# Patient Record
Sex: Male | Born: 1990 | Race: Black or African American | Hispanic: No | Marital: Single | Smoking: Never smoker
Health system: Southern US, Community
[De-identification: ages and names within clinical notes are randomized; demographics above are authoritative.]

---

## 2018-08-06 ENCOUNTER — Encounter (HOSPITAL_COMMUNITY): Payer: Self-pay

## 2018-08-06 ENCOUNTER — Ambulatory Visit (HOSPITAL_COMMUNITY)
Admission: EM | Admit: 2018-08-06 | Discharge: 2018-08-06 | Disposition: A | Discharge status: Home / Self Care | Payer: Self-pay | Attending: Family Medicine | Admitting: Family Medicine

## 2018-08-06 ENCOUNTER — Other Ambulatory Visit: Payer: Self-pay

## 2018-08-06 DIAGNOSIS — M25562 Pain in left knee: Secondary | ICD-10-CM

## 2018-08-06 DIAGNOSIS — M25561 Pain in right knee: Secondary | ICD-10-CM

## 2018-08-06 MED ORDER — IBUPROFEN 600 MG PO TABS: 600.0000 mg | ORAL_TABLET | Freq: Four times a day (QID) | ORAL | 0 refills | Status: AC | PRN

## 2018-08-06 NOTE — ED Triage Notes (Signed)
Patient presents to Urgent Care with complaints of bilateral knee pain since 2 weeks ago. Patient reports he has had difficulty walking recently, stating his knees give out under him because of a feeling of fluid in his knees.

## 2018-08-06 NOTE — Discharge Instructions (Addendum)
The exam of your knees today was normal.  Take the prescribed ibuprofen for pain.  Follow-up with the orthopedic listed.  Return here or to the emergency department if you develop numbness, tingling, weakness, bruising, warmth, redness in your knees or legs.

## 2018-08-06 NOTE — ED Provider Notes (Signed)
[place]    CSN: [id number] Arrival date & time: 08/06/18  1040     History   Chief Complaint Chief Complaint  Patient presents with  . Knee Pain    HPI Austin Cervantes is a 28 y.o. male.   He presents today with bilateral knee pain x2 weeks and feeling that he has "fluid" behind his knees.  He denies injury but states he has a history 2 years ago of torn ligaments in his left foot.  He has tried rest ice and elevation for the pain.  He denies numbness, tingling, weakness, bruising, warmth, redness, wounds.   The history is provided by the patient.    History reviewed. No pertinent past medical history.  There are no active problems to display for this patient.   History reviewed. No pertinent surgical history.     Home Medications    Prior to Admission medications   Medication Sig Start Date End Date Taking? Authorizing Provider  ibuprofen (ADVIL) 600 MG tablet Take 1 tablet (600 mg total) by mouth every 6 (six) hours as needed. 08/06/18   Sharion Balloon, NP    Family History Family History  Problem Relation Age of Onset  . Cancer Mother   . Diabetes Mother   . Hypertension Mother     Social History Social History   Tobacco Use  . Smoking status: Never Smoker  . Smokeless tobacco: Never Used  Substance Use Topics  . Alcohol use: Not Currently  . Drug use: Not on file     Allergies   Patient has no known allergies.   Review of Systems Review of Systems  Constitutional: Negative for chills and fever.  HENT: Negative for ear pain and sore throat.   Eyes: Negative for pain and visual disturbance.  Respiratory: Negative for cough and shortness of breath.   Cardiovascular: Negative for chest pain and palpitations.  Gastrointestinal: Negative for abdominal pain and vomiting.  Genitourinary: Negative for dysuria and hematuria.  Musculoskeletal: Positive for arthralgias. Negative for back pain.  Skin: Negative for color change and rash.   Neurological: Negative for seizures and syncope.  All other systems reviewed and are negative.    Physical Exam Triage Vital Signs ED Triage Vitals  Enc Vitals Group     BP 08/06/18 1119 130/78     Pulse Rate 08/06/18 1119 80     Resp 08/06/18 1119 17     Temp 08/06/18 1119 98.8 F (37.1 C)     Temp Source 08/06/18 1119 Oral     SpO2 08/06/18 1119 100 %     Weight --      Height --      Head Circumference --      Peak Flow --      Pain Score 08/06/18 1116 3     Pain Loc --      Pain Edu? --      Excl. in [place]? --    No data found.  Updated Vital Signs BP 130/78 (BP Location: Right Arm)   Pulse 80   Temp 98.8 F (37.1 C) (Oral)   Resp 17   SpO2 100%   Visual Acuity Right Eye Distance:   Left Eye Distance:   Bilateral Distance:    Right Eye Near:   Left Eye Near:    Bilateral Near:     Physical Exam Vitals signs and nursing note reviewed.  Constitutional:      Appearance: He is well-developed.  HENT:  Head: Normocephalic and atraumatic.  Eyes:     Conjunctiva/sclera: Conjunctivae normal.  Neck:     Musculoskeletal: Neck supple.  Cardiovascular:     Rate and Rhythm: Normal rate and regular rhythm.     Heart sounds: No murmur.  Pulmonary:     Effort: Pulmonary effort is normal. No respiratory distress.     Breath sounds: Normal breath sounds.  Abdominal:     Palpations: Abdomen is soft.     Tenderness: There is no abdominal tenderness.  Musculoskeletal: Normal range of motion.        General: No swelling, tenderness or deformity.     Right knee: Normal.     Left knee: Normal.     Right lower leg: No edema.     Left lower leg: No edema.  Skin:    General: Skin is warm and dry.  Neurological:     Mental Status: He is alert.     Sensory: No sensory deficit.     Motor: No weakness.     Coordination: Coordination normal.     Gait: Gait normal.     Deep Tendon Reflexes: Reflexes normal.      UC Treatments / Results  Labs (all labs ordered  are listed, but only abnormal results are displayed) Labs Reviewed - No data to display  EKG   Radiology No results found.  Procedures Procedures (including critical care time)  Medications Ordered in UC Medications - No data to display  Initial Impression / Assessment and Plan / UC Course  I have reviewed the triage vital signs and the nursing notes.  Pertinent labs & imaging results that were available during my care of the patient were reviewed by me and considered in my medical decision making (see chart for details).   Bilateral knee pain.  Exam normal today.  Treating with ibuprofen as needed for pain.  Discussed with patient that he could follow-up with orthopedics for additional evaluation if his symptoms persist.  And to return here or go to the emergency department if he develops numbness, tingling, weakness, bruising, warmth, redness, any other concerns.   Final Clinical Impressions(s) / UC Diagnoses   Final diagnoses:  Acute pain of both knees     Discharge Instructions     The exam of your knees today was normal.  Take the prescribed ibuprofen for pain.  Follow-up with the orthopedic listed.  Return here or to the emergency department if you develop numbness, tingling, weakness, bruising, warmth, redness in your knees or legs.    ED Prescriptions    Medication Sig Dispense Auth. Provider   ibuprofen (ADVIL) 600 MG tablet Take 1 tablet (600 mg total) by mouth every 6 (six) hours as needed. 30 tablet Mickie Bailate, Ruairi Stutsman H, NP     Controlled Substance Prescriptions [place] Controlled Substance Registry consulted? Not Applicable   Mickie Bailate, Aaren Atallah H, NP 08/06/18 1301

## 2018-08-26 ENCOUNTER — Other Ambulatory Visit: Payer: Self-pay | Admitting: Sports Medicine

## 2018-08-26 DIAGNOSIS — G8929 Other chronic pain: Secondary | ICD-10-CM

## 2018-08-26 DIAGNOSIS — M25561 Pain in right knee: Secondary | ICD-10-CM

## 2018-08-26 DIAGNOSIS — M25562 Pain in left knee: Secondary | ICD-10-CM

## 2018-09-17 ENCOUNTER — Ambulatory Visit
Admission: RE | Admit: 2018-09-17 | Discharge: 2018-09-17 | Disposition: A | Discharge status: Home / Self Care | Payer: Self-pay | Source: Ambulatory Visit | Attending: Sports Medicine | Admitting: Sports Medicine

## 2018-09-17 ENCOUNTER — Other Ambulatory Visit: Payer: Self-pay

## 2018-09-17 DIAGNOSIS — G8929 Other chronic pain: Secondary | ICD-10-CM

## 2018-09-17 DIAGNOSIS — M25561 Pain in right knee: Secondary | ICD-10-CM

## 2018-09-17 DIAGNOSIS — M25562 Pain in left knee: Secondary | ICD-10-CM

## 2019-02-11 ENCOUNTER — Other Ambulatory Visit: Payer: Self-pay

## 2019-02-11 DIAGNOSIS — Z20822 Contact with and (suspected) exposure to covid-19: Secondary | ICD-10-CM

## 2019-02-12 LAB — NOVEL CORONAVIRUS, NAA: SARS-CoV-2, NAA: NOT DETECTED

## 2021-02-19 IMAGING — MR MRI OF THE LEFT KNEE WITHOUT CONTRAST
7 series · 40 of 40 positions shown · non-contrast
Comparison: None.

CLINICAL DATA: Chronic left knee pain for the past 2 months. No
injury.

EXAM:
MRI OF THE LEFT KNEE WITHOUT CONTRAST
TECHNIQUE: Multiplanar, multisequence MR imaging of the knee was performed. No
intravenous contrast was administered.

[Series 6: T2 fat-sat · axial · left · 4.0mm · 0.50mm/px · z∈[-68,+84]mm · 8 of 36 slices shown (1 of 3)]
[im 1/36]
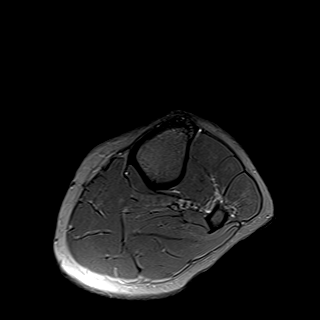
[im 6/36]
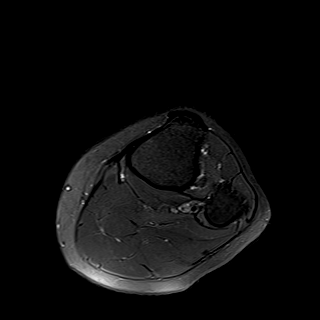
[im 11/36]
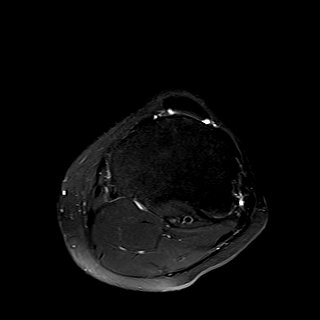
[im 16/36]
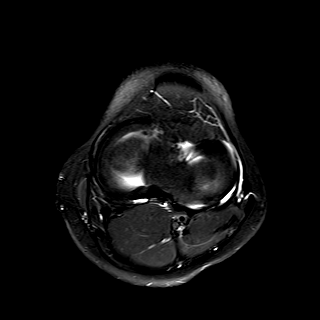
[im 21/36]
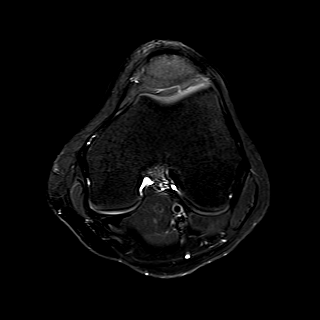
[im 26/36]
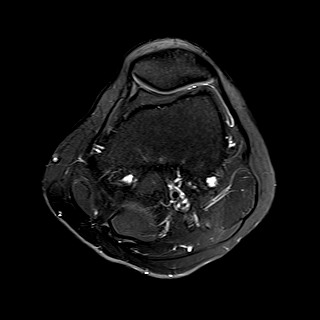
[im 31/36]
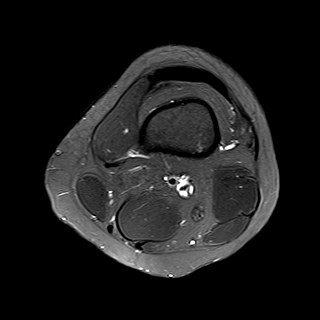
[im 36/36]
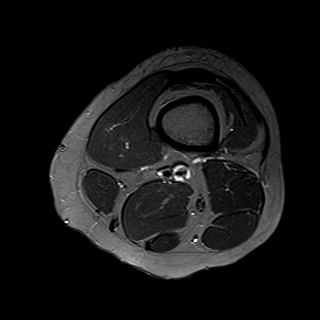

[Series 7: T2 fat-sat · coronal · left · 4.0mm · 0.39mm/px · 5 of 25 slices shown (2 of 3)]
[im 1/25]
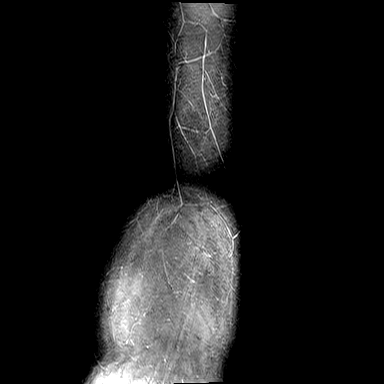
[im 7/25]
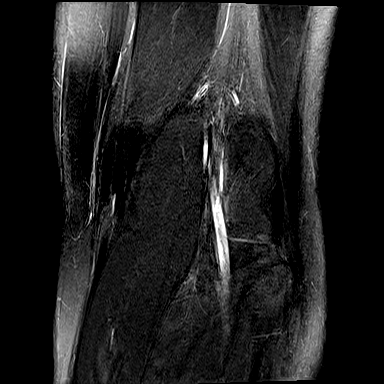
[im 13/25]
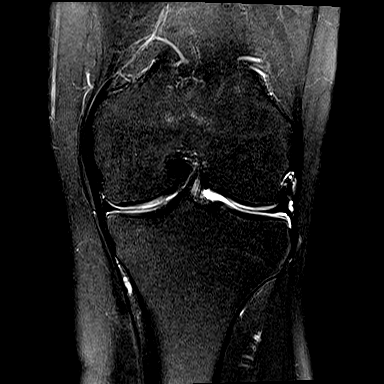
[im 19/25]
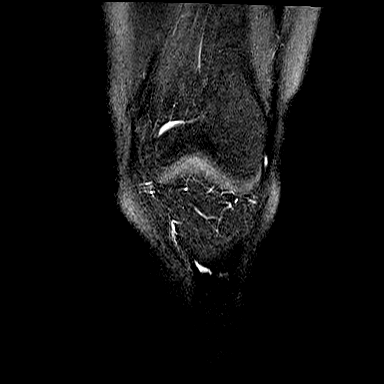
[im 25/25]
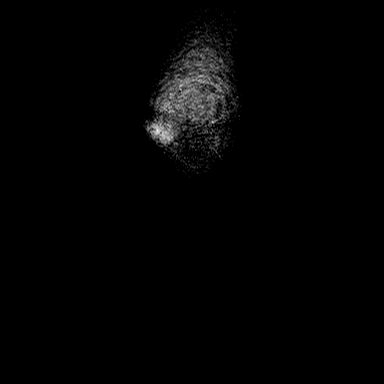

[Series 8: T1 · coronal · left · 4.0mm · 0.39mm/px · 5 of 25 slices shown]
[im 1/25]
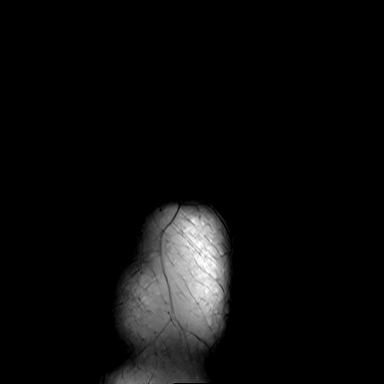
[im 7/25]
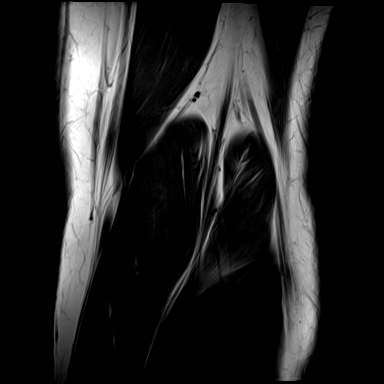
[im 13/25]
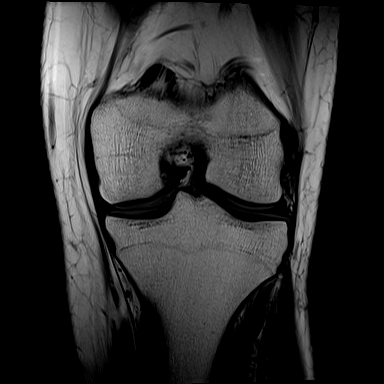
[im 19/25]
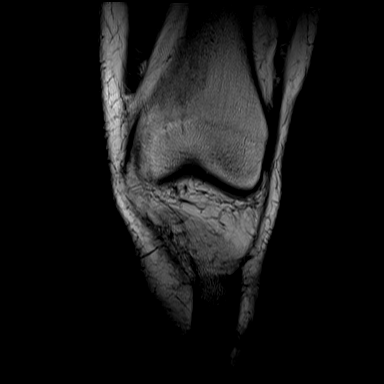
[im 25/25]
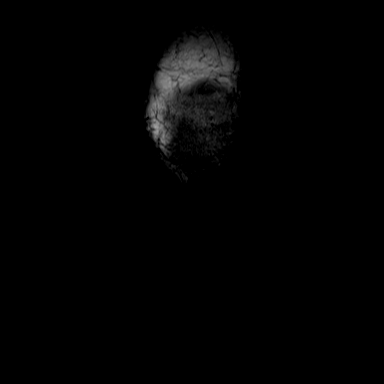

[Series 9: PD fat-sat · coronal · left · 3.0mm · 0.47mm/px · 6 of 31 slices shown (1 of 2)]
[im 1/31]
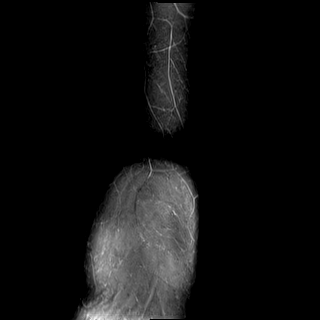
[im 7/31]
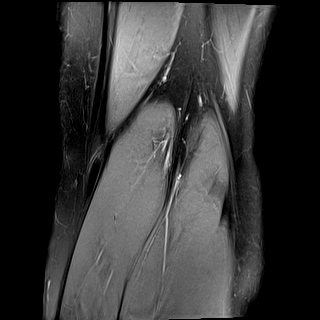
[im 13/31]
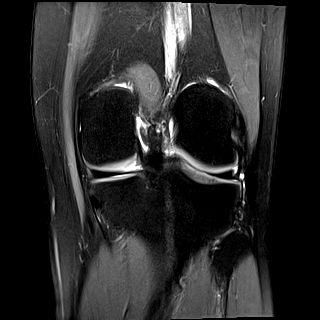
[im 19/31]
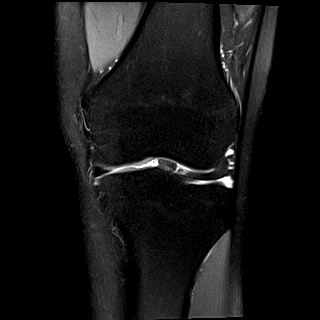
[im 25/31]
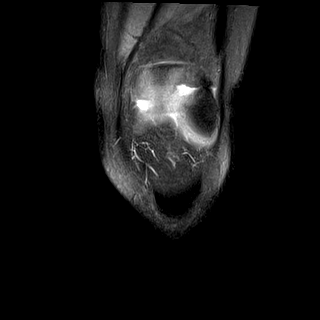
[im 31/31]
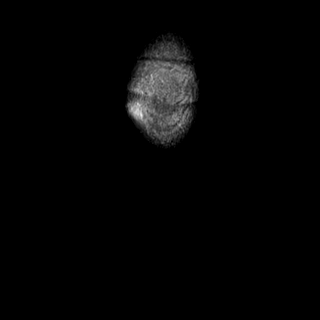

[Series 10: PD fat-sat · sagittal · left · 3.0mm · 0.47mm/px · 6 of 27 slices shown (2 of 2)]
[im 1/27]
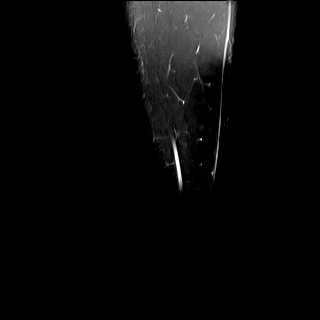
[im 6/27]
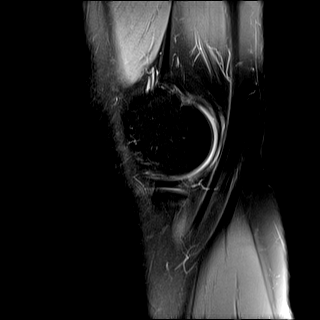
[im 11/27]
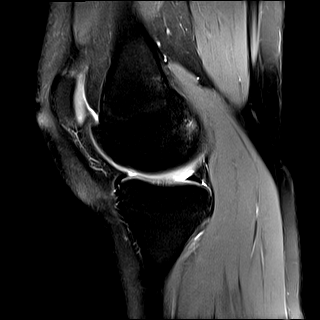
[im 16/27]
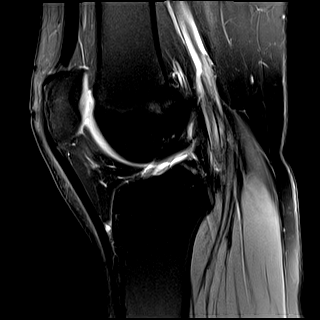
[im 21/27]
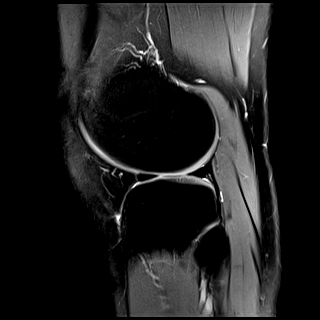
[im 27/27]
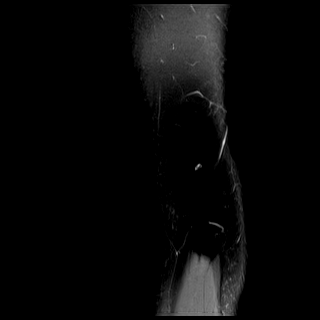

[Series 11: T2 fat-sat · sagittal · left · 3.0mm · 0.47mm/px · 6 of 27 slices shown (3 of 3)]
[im 1/27]
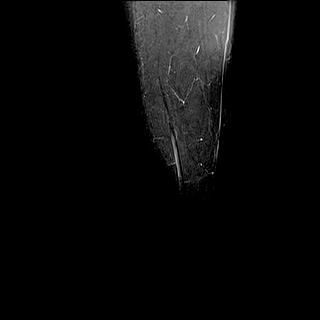
[im 6/27]
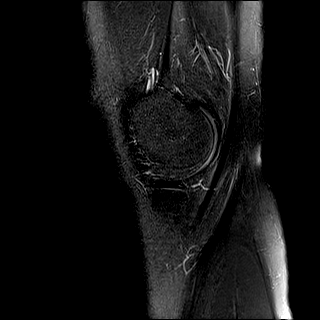
[im 11/27]
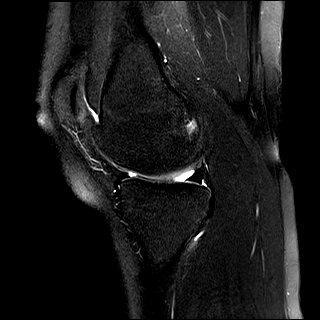
[im 16/27]
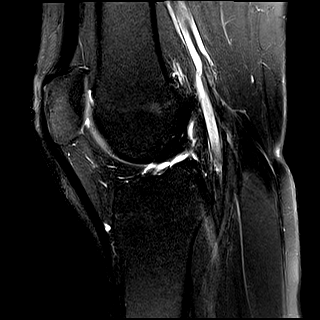
[im 21/27]
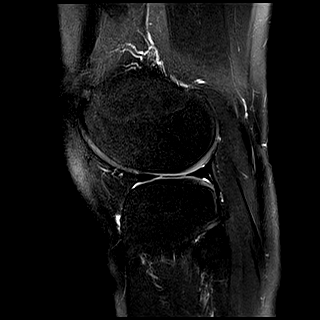
[im 27/27]
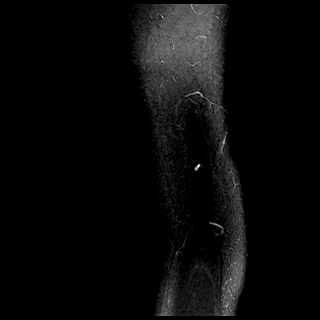

[Series 12: PD · oblique · left · 1.5mm · 0.44mm/px · 4 of 21 slices shown]
[im 1/21]
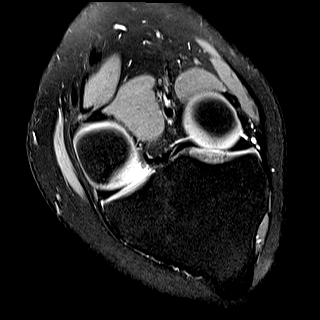
[im 7/21]
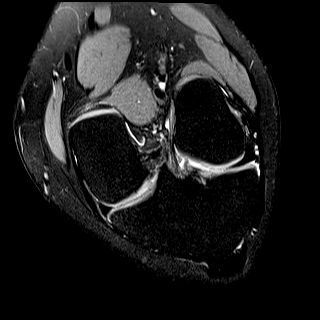
[im 14/21]
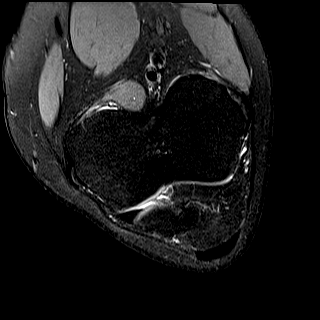
[im 21/21]
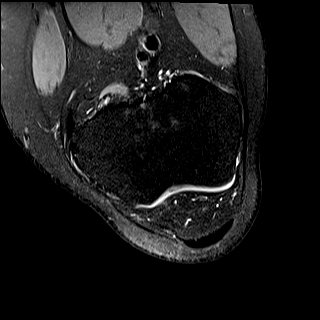

[40 of 40 positions shown; findings below may reference images not displayed]

FINDINGS: MENISCI

Medial meniscus:  Intact.

Lateral meniscus:  Intact.

LIGAMENTS

Cruciates:  Intact ACL and PCL.

Collaterals: Medial collateral ligament is intact. Lateral
collateral ligament complex is intact.

CARTILAGE

Patellofemoral:  Normal.

Medial:  Normal.

Lateral:  Normal.

Joint:  No joint effusion. Normal Hoffa's fat. No plical thickening.

Popliteal Fossa:  No Baker cyst. Intact popliteus tendon.

Extensor Mechanism: Intact quadriceps tendon and patellar tendon.
Intact medial and lateral patellar retinaculum. Intact MPFL.

Bones: No focal marrow signal abnormality. No fracture or
dislocation.

Other: None.
IMPRESSION: 1. Normal MRI of the left knee.
